# Patient Record
Sex: Female | Born: 1995 | Race: White | Hispanic: No | Marital: Single | Smoking: Former smoker
Health system: Southern US, Community
[De-identification: ages and names within clinical notes are randomized; demographics above are authoritative.]

## PROBLEM LIST (undated history)

## (undated) DIAGNOSIS — F419 Anxiety disorder, unspecified: Secondary | ICD-10-CM

## (undated) HISTORY — DX: Anxiety disorder, unspecified: F41.9

---
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
## 2018-02-20 DIAGNOSIS — R112 Nausea with vomiting, unspecified: Secondary | ICD-10-CM | POA: Diagnosis not present

## 2018-02-20 DIAGNOSIS — J101 Influenza due to other identified influenza virus with other respiratory manifestations: Secondary | ICD-10-CM | POA: Diagnosis not present

## 2018-02-20 DIAGNOSIS — J039 Acute tonsillitis, unspecified: Secondary | ICD-10-CM | POA: Diagnosis not present

## 2018-12-05 DIAGNOSIS — Z124 Encounter for screening for malignant neoplasm of cervix: Secondary | ICD-10-CM | POA: Diagnosis not present

## 2018-12-05 DIAGNOSIS — Z6830 Body mass index (BMI) 30.0-30.9, adult: Secondary | ICD-10-CM | POA: Diagnosis not present

## 2018-12-05 DIAGNOSIS — Z3040 Encounter for surveillance of contraceptives, unspecified: Secondary | ICD-10-CM | POA: Diagnosis not present

## 2018-12-05 DIAGNOSIS — Z01419 Encounter for gynecological examination (general) (routine) without abnormal findings: Secondary | ICD-10-CM | POA: Diagnosis not present

## 2019-09-02 DIAGNOSIS — H6091 Unspecified otitis externa, right ear: Secondary | ICD-10-CM | POA: Diagnosis not present

## 2019-09-23 ENCOUNTER — Other Ambulatory Visit: Payer: Self-pay

## 2019-09-23 DIAGNOSIS — Z20822 Contact with and (suspected) exposure to covid-19: Secondary | ICD-10-CM

## 2019-09-24 LAB — NOVEL CORONAVIRUS, NAA: SARS-CoV-2, NAA: NOT DETECTED

## 2019-10-10 ENCOUNTER — Other Ambulatory Visit: Payer: Self-pay

## 2019-10-10 DIAGNOSIS — Z20822 Contact with and (suspected) exposure to covid-19: Secondary | ICD-10-CM

## 2019-10-13 LAB — NOVEL CORONAVIRUS, NAA: SARS-CoV-2, NAA: NOT DETECTED

## 2019-11-07 DIAGNOSIS — R0789 Other chest pain: Secondary | ICD-10-CM | POA: Diagnosis not present

## 2019-11-14 DIAGNOSIS — R0789 Other chest pain: Secondary | ICD-10-CM | POA: Diagnosis not present

## 2019-11-14 DIAGNOSIS — K219 Gastro-esophageal reflux disease without esophagitis: Secondary | ICD-10-CM | POA: Diagnosis not present

## 2019-12-15 DIAGNOSIS — Z13220 Encounter for screening for lipoid disorders: Secondary | ICD-10-CM | POA: Diagnosis not present

## 2019-12-15 DIAGNOSIS — Z124 Encounter for screening for malignant neoplasm of cervix: Secondary | ICD-10-CM | POA: Diagnosis not present

## 2019-12-15 DIAGNOSIS — Z130 Encounter for screening for diseases of the blood and blood-forming organs and certain disorders involving the immune mechanism: Secondary | ICD-10-CM | POA: Diagnosis not present

## 2019-12-15 DIAGNOSIS — Z1329 Encounter for screening for other suspected endocrine disorder: Secondary | ICD-10-CM | POA: Diagnosis not present

## 2019-12-15 DIAGNOSIS — Z01419 Encounter for gynecological examination (general) (routine) without abnormal findings: Secondary | ICD-10-CM | POA: Diagnosis not present

## 2019-12-15 DIAGNOSIS — Z131 Encounter for screening for diabetes mellitus: Secondary | ICD-10-CM | POA: Diagnosis not present

## 2019-12-15 DIAGNOSIS — R079 Chest pain, unspecified: Secondary | ICD-10-CM | POA: Diagnosis not present

## 2019-12-15 DIAGNOSIS — E559 Vitamin D deficiency, unspecified: Secondary | ICD-10-CM | POA: Diagnosis not present

## 2019-12-19 ENCOUNTER — Other Ambulatory Visit: Payer: Self-pay

## 2019-12-19 ENCOUNTER — Ambulatory Visit (INDEPENDENT_AMBULATORY_CARE_PROVIDER_SITE_OTHER): Payer: BC Managed Care – PPO | Admitting: Cardiology

## 2019-12-19 ENCOUNTER — Encounter: Payer: Self-pay | Admitting: Cardiology

## 2019-12-19 VITALS — BP 90/60 | HR 98 | Temp 97.9°F | Ht 63.0 in | Wt 173.0 lb

## 2019-12-19 DIAGNOSIS — Z7189 Other specified counseling: Secondary | ICD-10-CM | POA: Diagnosis not present

## 2019-12-19 DIAGNOSIS — R079 Chest pain, unspecified: Secondary | ICD-10-CM

## 2019-12-19 NOTE — Progress Notes (Signed)
Cardiology Office Note:    Date:  12/19/2019   ID:  Allison Medina, DOB 1996/02/11, MRN [id number]  PCP:  No primary care provider on file.  Cardiologist:  Jodelle Red, MD  Referring MD: Essie Hart, MD   CC: new patient consult for chest pain  History of Present Illness:    Allison Medina is a 24 y.o. female with a hx of GERD, anxiety who is seen as a new consult at the request of Essie Hart, MD for the evaluation and management of chest pain.  Chest pain: -Initial onset: feels like a pressure/heaviness. Has been going on for several months. Saw PCP about it in 10/2019, started treatment for GERD. Also has anxiety, which has worsened during the pandemic. Treatment for GERD did not change her symptoms.  -Quality: heaviness, upper chest spreading to the right side.  -Frequency: every day -Duration: constant, lasts all day. Sometimes doesn't notice if she is distracted.  -Associated symptoms: no shortness of breath, lightheadedness, nausea, diaphoresis. -Aggravating/alleviating factors: worse with stress, occasionally worse after eating heavy/rich meal. Not worse with exertion. -Prior cardiac history: none -Prior workup: none  -Prior treatment: tried GERD medications, didn't help -Alcohol: less than weekly basis, occasional -Tobacco: no tobacco, has vaped in the past but not recently -Comorbidities: no hypertension, no high cholesterol, no kidney disease -Exercise level: went back to school, stays busy. Walks dog with her boyfriend. -Cardiac ROS: no shortness of breath, no PND, no orthopnea, no LE edema. Has remote history of syncope, none recently.  -Family history: no heart disease that she is aware of.   Past Medical History:  Diagnosis Date  . Anxiety     History reviewed. No pertinent surgical history.  Current Medications: Current Outpatient Medications on File Prior to Visit  Medication Sig  . pantoprazole (PROTONIX) 40 MG tablet Take 40 mg by mouth  daily.   No current facility-administered medications on file prior to visit.     Allergies:   Patient has no known allergies.   Social History   Tobacco Use  . Smoking status: Light Tobacco Smoker  . Smokeless tobacco: Never Used  Substance Use Topics  . Alcohol use: Not on file  . Drug use: Not on file    Family History: No heart disease or stroke that she is aware of  ROS:   Please see the history of present illness.  Additional pertinent ROS: Constitutional: Negative for chills, fever, night sweats, unintentional weight loss  HENT: Negative for ear pain and hearing loss.   Eyes: Negative for loss of vision and eye pain.  Respiratory: Negative for cough, sputum, wheezing.   Cardiovascular: See HPI. Gastrointestinal: Negative for abdominal pain, melena, and hematochezia.  Genitourinary: Negative for dysuria and hematuria.  Musculoskeletal: Negative for falls and myalgias.  Skin: Negative for itching and rash.  Neurological: Negative for focal weakness, focal sensory changes and loss of consciousness.  Endo/Heme/Allergies: Does not bruise/bleed easily.     EKGs/Labs/Other Studies Reviewed:    The following studies were reviewed today: No prior cardiac studies  EKG:  EKG is personally reviewed.  The ekg ordered today demonstrates NSR, nonspecific T wave flattening. No st elevation or diffuse PR depression  Recent Labs: No results found for requested labs within last 8760 hours.  Recent Lipid Panel No results found for: CHOL, TRIG, HDL, CHOLHDL, VLDL, LDLCALC, LDLDIRECT  Physical Exam:    VS:  Temp 97.9 F (36.6 C) (Temporal)   Ht 5\' 3"  (1.6 m)   Wt 173  lb (78.5 kg)   BMI 30.65 kg/m     Wt Readings from Last 3 Encounters:  12/19/19 173 lb (78.5 kg)    GEN: Well nourished, well developed in no acute distress HEENT: Normal, moist mucous membranes NECK: No JVD CARDIAC: regular rhythm, normal S1 and S2, no rubs or gallops. No murmurs. VASCULAR: Radial and DP  pulses 2+ bilaterally. No carotid bruits RESPIRATORY:  Clear to auscultation without rales, wheezing or rhonchi  ABDOMEN: Soft, non-tender, non-distended MUSCULOSKELETAL:  Ambulates independently SKIN: Warm and dry, no edema NEUROLOGIC:  Alert and oriented x 3. No focal neuro deficits noted. PSYCHIATRIC:  Normal affect    ASSESSMENT:    1. Chest pain, unspecified type   2. Cardiac risk counseling   3. Counseling on health promotion and disease prevention    PLAN:    Chest pain: We spent 40 minutes today face to face discussing chest pain. Her symptoms are atypical for a cardiac cause. ECG unremarkable.  We spent significant time today reviewing different parts of the cardiovascular system (electrical, vascular, functional, and valvular). We discussed how each of these systems can present with different symptoms. We reviewed that there are different ways we evaluate these symptoms with tests. We reviewed which tests I think are most appropriate given the symptoms, and we discussed risks/benefits and limitations of each of these tests. Please see summary below. We also discussed that if testing is unrevealing for a cardiac cause of the symptoms, there are many noncardiac causes as well that can contribute to symptoms. If the heart is ruled out, then I recommend returning to PCP to discuss alternative diagnoses.  We discussed that overall, given her age and (lack of) risk factors, she is overall very low risk for this to be cardiac in nature.   I discussed my suggestions for testing, with the following three options: 1) no testing, which is appropriate given her symptoms and risk 2) exercise treadmill stress test. The pain is nonexertional, and I do not feel that we need to pursue this at this time. However, if the pain becomes more exertional or worsens, we could consider. There is a high false positive rate in low risk groups, which we discussed 3) echocardiogram. This would rule out  structural abnormalities. No symptoms suggestive of pericarditis. However, if symptoms persist, this would be reasonable to rule out issues. Offered today, but she would prefer to wait and see how things go.  I gave her my information and offered to see her back at her convenience if things do not improve.  Cardiac risk counseling and prevention recommendations: -recommend heart healthy/Mediterranean diet, with whole grains, fruits, vegetable, fish, lean meats, nuts, and olive oil. Limit salt. -recommend moderate walking, 3-5 times/week for 30-50 minutes each session. Aim for at least 150 minutes.week. Goal should be pace of 3 miles/hours, or walking 1.5 miles in 30 minutes -recommend avoidance of tobacco products. Avoid excess alcohol. -ASCVD risk score: The ASCVD Risk score Mikey Bussing DC Jr., et al., 2013) failed to calculate for the following reasons:   The 2013 ASCVD risk score is only valid for ages 54 to 55    Plan for follow up:  Total time of encounter: 55 minutes total time of encounter, including 40 minutes spent in face-to-face patient care. This time includes coordination of care and counseling regarding chest pain. Remainder of non-face-to-face time involved reviewing chart documents/testing relevant to the patient encounter and documentation in the medical record.  Buford Dresser, MD, PhD [facility]  [facility]    Medication Adjustments/Labs and Tests Ordered: Current medicines are reviewed at length with the patient today.  Concerns regarding medicines are outlined above.  Orders Placed This Encounter  Procedures  . EKG 12-Lead   No orders of the defined types were placed in this encounter.   Patient Instructions  Medication Instructions:  Your Physician recommend you continue on your current medication as directed.    *If you need a refill on your cardiac medications before your next appointment, please call your pharmacy*  Lab Work: None   Testing/Procedures: None  Follow-Up: At [facility], you and your health needs are our priority.  As part of our continuing mission to provide you with exceptional heart care, we have created designated Provider Care Teams.  These Care Teams include your primary Cardiologist (physician) and Advanced Practice Providers (APPs -  Physician Assistants and Nurse Practitioners) who all work together to provide you with the care you need, when you need it.  Your next appointment:   As Needed  The format for your next appointment:   Either In Person or Virtual  Provider:   Jodelle Red, MD     Signed, Jodelle Red, MD PhD 12/19/2019     [facility]

## 2019-12-19 NOTE — Patient Instructions (Signed)
Medication Instructions:  Your Physician recommend you continue on your current medication as directed.    *If you need a refill on your cardiac medications before your next appointment, please call your pharmacy*  Lab Work: None  Testing/Procedures: None  Follow-Up: At [facility], you and your health needs are our priority.  As part of our continuing mission to provide you with exceptional heart care, we have created designated Provider Care Teams.  These Care Teams include your primary Cardiologist (physician) and Advanced Practice Providers (APPs -  Physician Assistants and Nurse Practitioners) who all work together to provide you with the care you need, when you need it.  Your next appointment:   As Needed  The format for your next appointment:   Either In Person or Virtual  Provider:   Jodelle Red, MD

## 2019-12-20 ENCOUNTER — Encounter: Payer: Self-pay | Admitting: Cardiology

## 2019-12-26 DIAGNOSIS — Z30432 Encounter for removal of intrauterine contraceptive device: Secondary | ICD-10-CM | POA: Diagnosis not present

## 2019-12-26 DIAGNOSIS — Z30430 Encounter for insertion of intrauterine contraceptive device: Secondary | ICD-10-CM | POA: Diagnosis not present

## 2020-01-01 ENCOUNTER — Ambulatory Visit
Admission: RE | Admit: 2020-01-01 | Discharge: 2020-01-01 | Disposition: A | Discharge status: Home / Self Care | Payer: BC Managed Care – PPO | Source: Ambulatory Visit | Attending: Family Medicine | Admitting: Family Medicine

## 2020-01-01 ENCOUNTER — Other Ambulatory Visit: Payer: Self-pay | Admitting: Family Medicine

## 2020-01-01 DIAGNOSIS — R0789 Other chest pain: Secondary | ICD-10-CM | POA: Diagnosis not present

## 2020-01-01 DIAGNOSIS — F411 Generalized anxiety disorder: Secondary | ICD-10-CM | POA: Diagnosis not present

## 2020-01-01 DIAGNOSIS — N644 Mastodynia: Secondary | ICD-10-CM | POA: Diagnosis not present

## 2020-01-01 DIAGNOSIS — R079 Chest pain, unspecified: Secondary | ICD-10-CM

## 2020-01-09 ENCOUNTER — Ambulatory Visit
Admission: RE | Admit: 2020-01-09 | Discharge: 2020-01-09 | Disposition: A | Discharge status: Home / Self Care | Payer: BC Managed Care – PPO | Source: Ambulatory Visit | Attending: Family Medicine | Admitting: Family Medicine

## 2020-01-09 ENCOUNTER — Other Ambulatory Visit: Payer: Self-pay

## 2020-01-09 DIAGNOSIS — N644 Mastodynia: Secondary | ICD-10-CM

## 2020-02-11 DIAGNOSIS — Z30431 Encounter for routine checking of intrauterine contraceptive device: Secondary | ICD-10-CM | POA: Diagnosis not present

## 2020-04-24 DIAGNOSIS — J069 Acute upper respiratory infection, unspecified: Secondary | ICD-10-CM | POA: Diagnosis not present

## 2020-05-04 DIAGNOSIS — J020 Streptococcal pharyngitis: Secondary | ICD-10-CM | POA: Diagnosis not present

## 2020-11-25 DIAGNOSIS — Z20822 Contact with and (suspected) exposure to covid-19: Secondary | ICD-10-CM | POA: Diagnosis not present

## 2021-03-12 DIAGNOSIS — Z20822 Contact with and (suspected) exposure to covid-19: Secondary | ICD-10-CM | POA: Diagnosis not present

## 2021-06-12 IMAGING — DX DG CHEST 2V
2 series · 2 of 2 positions shown · non-contrast
Comparison: None.

CLINICAL DATA: Chest pain.

EXAM:
CHEST - 2 VIEW

[dg chest 2 view (1 of 2)]
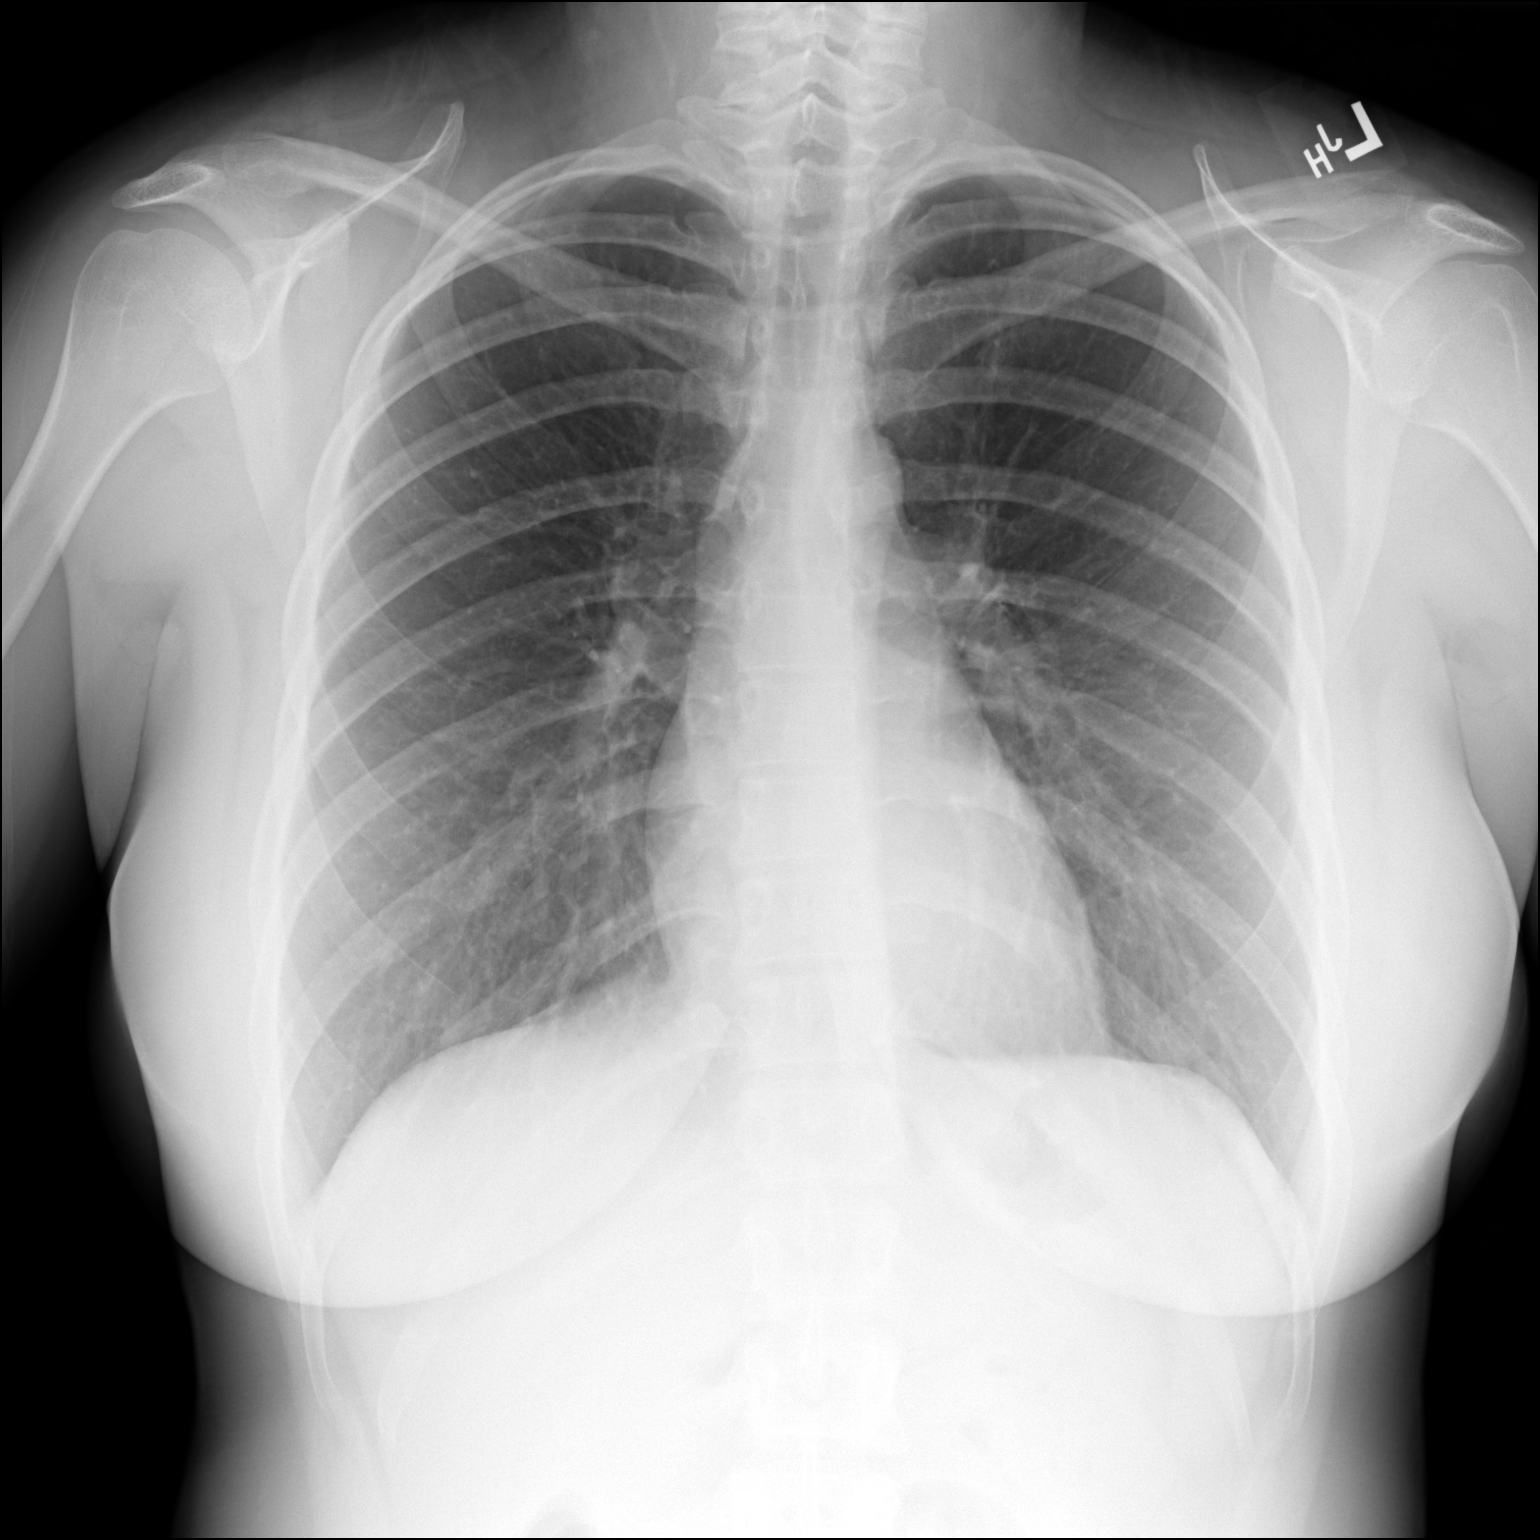

[dg chest 2 view (2 of 2)]
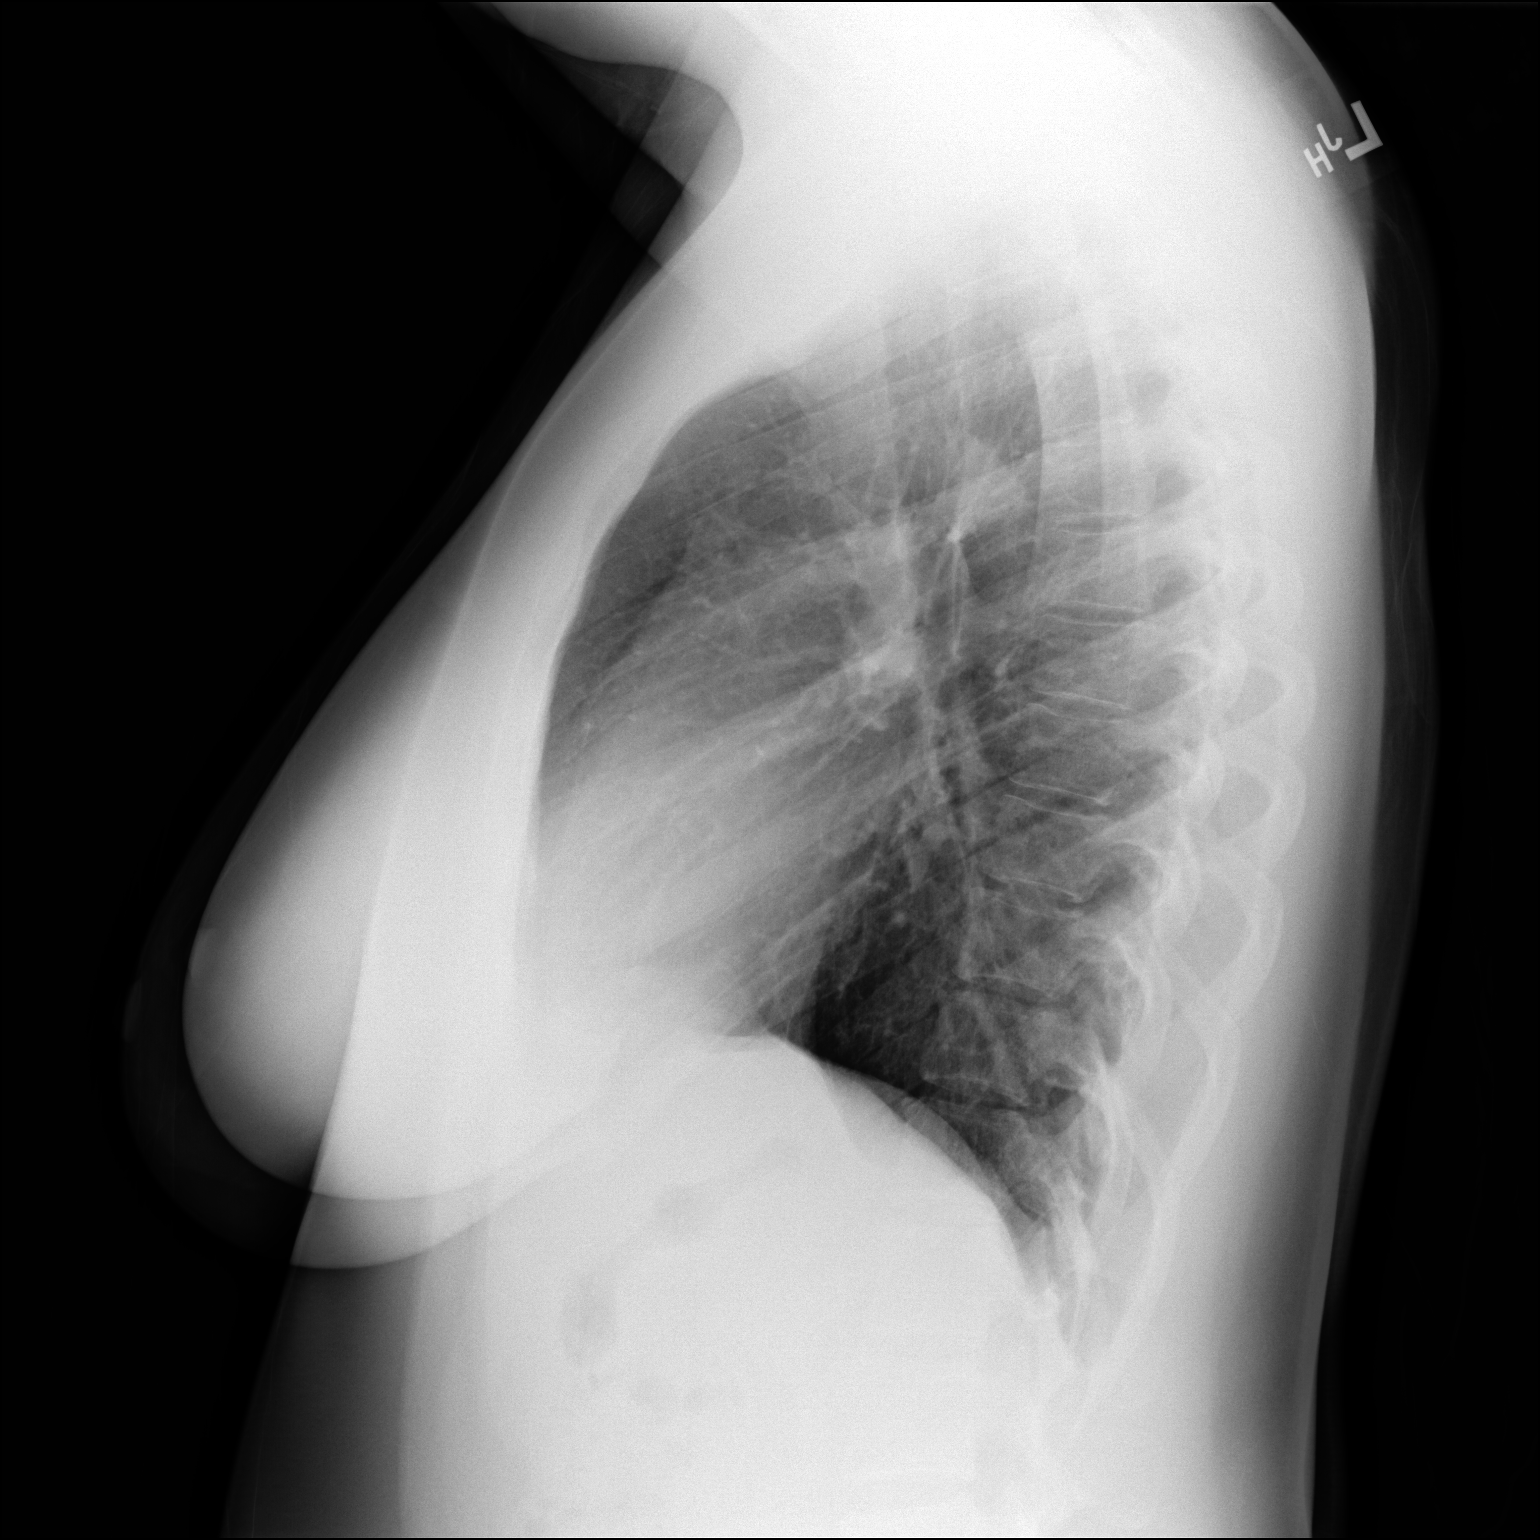

[2 of 2 positions shown; findings below may reference images not displayed]

FINDINGS: The heart size and mediastinal contours are within normal limits.
Both lungs are clear. The visualized skeletal structures are
unremarkable.
IMPRESSION: No active cardiopulmonary disease.

## 2021-06-20 IMAGING — US US BREAST*R* LIMITED INC AXILLA
1 series · 3 of 3 positions shown · non-contrast
Comparison: None

CLINICAL DATA: 23-year-old patient with recent tenderness in
superior right breast, as well as some chest pressure/heaviness.
Since the pain began, she was prescribed some anti-inflammatory
medication, with significant improvement in the tenderness. She
offers that she has had some anxiety recently which may have
contributed to her symptoms.

EXAM:
ULTRASOUND OF THE RIGHT BREAST

[Series 1: us breast*right* limited inc axilla · 0.09mm/px · 3 of 3 slices shown]
[im 1/3]
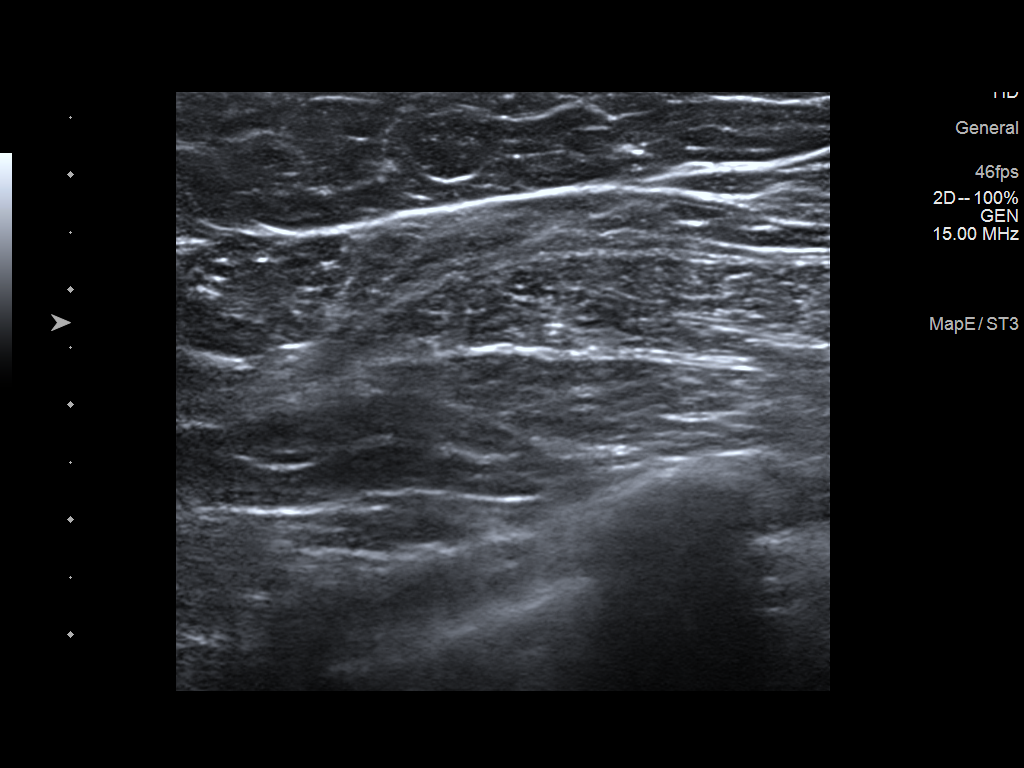
[im 2/3]
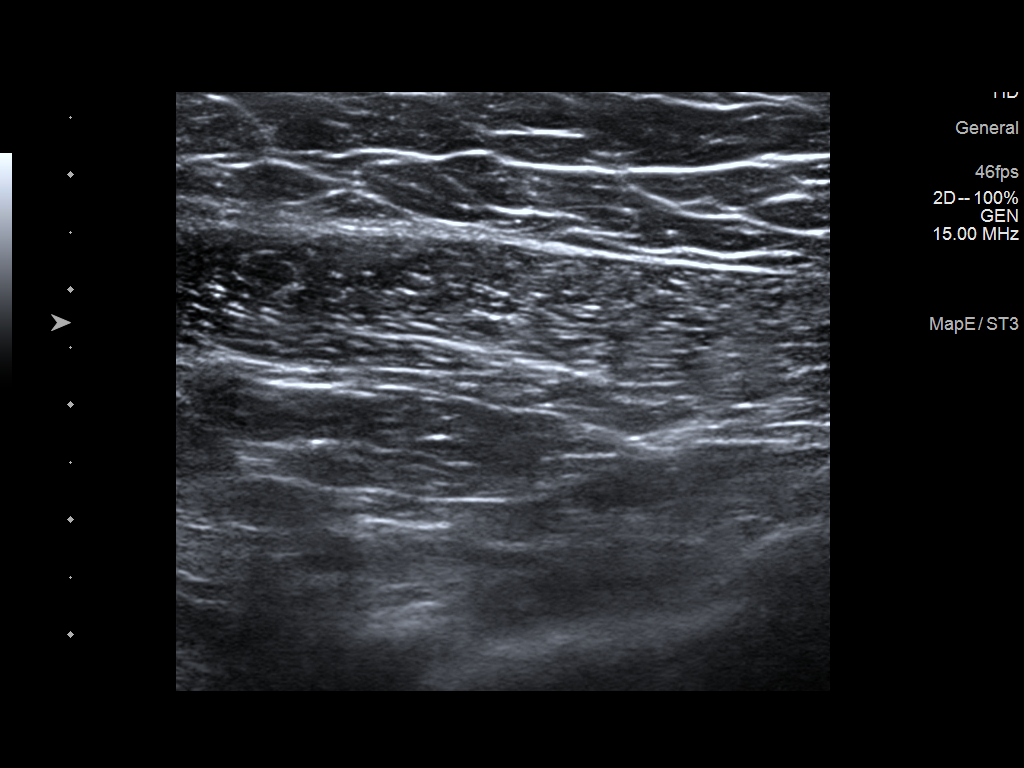
[im 3/3]
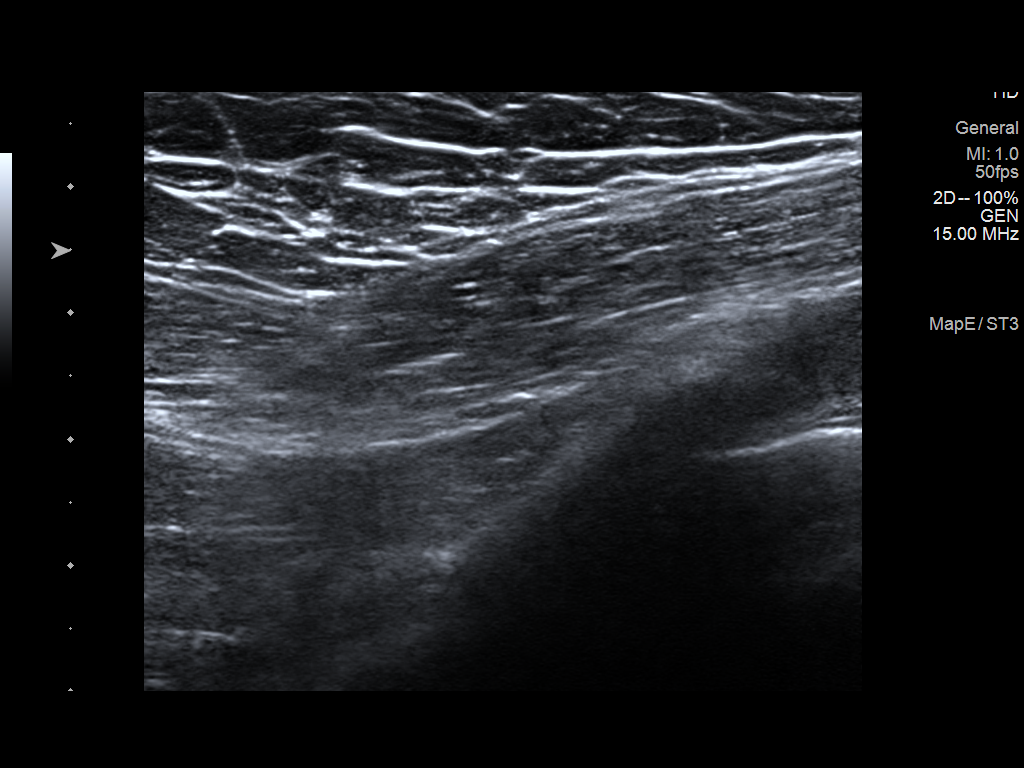

[3 of 3 positions shown; findings below may reference images not displayed]

FINDINGS: Targeted ultrasound is performed, showing normal fibroglandular
tissue in the axillary tail of the right breast at 11 o'clock
position, as well as normal fibroglandular tissue in the far
superior right breast in the 12 o'clock and 1 o'clock regions. No
solid or cystic mass, fluid collection, or skin thickening.
IMPRESSION: No evidence of malignancy in the right breast.

RECOMMENDATION:
Screening mammogram at age 40 unless there are persistent or
intervening clinical concerns. (Code:B7-5-PCH)

I have discussed the findings and recommendations with the patient.
If applicable, a reminder letter will be sent to the patient
regarding the next appointment.

BI-RADS CATEGORY  1: Negative.

## 2021-06-27 DIAGNOSIS — J029 Acute pharyngitis, unspecified: Secondary | ICD-10-CM | POA: Diagnosis not present

## 2021-06-27 DIAGNOSIS — Z20818 Contact with and (suspected) exposure to other bacterial communicable diseases: Secondary | ICD-10-CM | POA: Diagnosis not present

## 2021-06-28 ENCOUNTER — Ambulatory Visit (HOSPITAL_COMMUNITY): Payer: Self-pay

## 2021-06-29 DIAGNOSIS — N898 Other specified noninflammatory disorders of vagina: Secondary | ICD-10-CM | POA: Diagnosis not present

## 2021-06-29 DIAGNOSIS — Z113 Encounter for screening for infections with a predominantly sexual mode of transmission: Secondary | ICD-10-CM | POA: Diagnosis not present

## 2021-06-29 DIAGNOSIS — Z124 Encounter for screening for malignant neoplasm of cervix: Secondary | ICD-10-CM | POA: Diagnosis not present

## 2021-06-29 DIAGNOSIS — Z01411 Encounter for gynecological examination (general) (routine) with abnormal findings: Secondary | ICD-10-CM | POA: Diagnosis not present

## 2021-07-13 DIAGNOSIS — R102 Pelvic and perineal pain: Secondary | ICD-10-CM | POA: Diagnosis not present

## 2021-07-13 DIAGNOSIS — N760 Acute vaginitis: Secondary | ICD-10-CM | POA: Diagnosis not present

## 2021-07-13 DIAGNOSIS — L292 Pruritus vulvae: Secondary | ICD-10-CM | POA: Diagnosis not present

## 2021-07-26 DIAGNOSIS — R309 Painful micturition, unspecified: Secondary | ICD-10-CM | POA: Diagnosis not present

## 2021-07-26 DIAGNOSIS — N898 Other specified noninflammatory disorders of vagina: Secondary | ICD-10-CM | POA: Diagnosis not present

## 2021-07-29 DIAGNOSIS — N898 Other specified noninflammatory disorders of vagina: Secondary | ICD-10-CM | POA: Diagnosis not present

## 2021-07-29 DIAGNOSIS — Z113 Encounter for screening for infections with a predominantly sexual mode of transmission: Secondary | ICD-10-CM | POA: Diagnosis not present

## 2024-03-02 DIAGNOSIS — B3731 Acute candidiasis of vulva and vagina: Secondary | ICD-10-CM | POA: Diagnosis not present

## 2024-03-02 DIAGNOSIS — Z6829 Body mass index (BMI) 29.0-29.9, adult: Secondary | ICD-10-CM | POA: Diagnosis not present

## 2024-04-22 DIAGNOSIS — Z7251 High risk heterosexual behavior: Secondary | ICD-10-CM | POA: Diagnosis not present

## 2024-04-22 DIAGNOSIS — B3731 Acute candidiasis of vulva and vagina: Secondary | ICD-10-CM | POA: Diagnosis not present

## 2024-04-22 DIAGNOSIS — N898 Other specified noninflammatory disorders of vagina: Secondary | ICD-10-CM | POA: Diagnosis not present

## 2024-04-22 DIAGNOSIS — Z6829 Body mass index (BMI) 29.0-29.9, adult: Secondary | ICD-10-CM | POA: Diagnosis not present

## 2024-06-30 DIAGNOSIS — N898 Other specified noninflammatory disorders of vagina: Secondary | ICD-10-CM | POA: Diagnosis not present

## 2024-06-30 DIAGNOSIS — Z30433 Encounter for removal and reinsertion of intrauterine contraceptive device: Secondary | ICD-10-CM | POA: Diagnosis not present

## 2024-07-31 DIAGNOSIS — Z30431 Encounter for routine checking of intrauterine contraceptive device: Secondary | ICD-10-CM | POA: Diagnosis not present
